# Patient Record
Sex: Male | Born: 1985 | Race: White | Hispanic: No | Marital: Single
Health system: Southern US, Community
[De-identification: ages and names within clinical notes are randomized; demographics above are authoritative.]

---
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
## 2016-03-11 DIAGNOSIS — R197 Diarrhea, unspecified: Secondary | ICD-10-CM | POA: Diagnosis not present

## 2016-03-11 DIAGNOSIS — K921 Melena: Secondary | ICD-10-CM | POA: Diagnosis not present

## 2016-05-11 DIAGNOSIS — K920 Hematemesis: Secondary | ICD-10-CM | POA: Diagnosis not present

## 2016-05-11 DIAGNOSIS — K921 Melena: Secondary | ICD-10-CM | POA: Diagnosis not present

## 2016-05-11 DIAGNOSIS — L509 Urticaria, unspecified: Secondary | ICD-10-CM | POA: Diagnosis not present

## 2016-05-11 DIAGNOSIS — R109 Unspecified abdominal pain: Secondary | ICD-10-CM | POA: Diagnosis not present

## 2016-07-24 DIAGNOSIS — F419 Anxiety disorder, unspecified: Secondary | ICD-10-CM | POA: Diagnosis not present

## 2016-07-24 DIAGNOSIS — L989 Disorder of the skin and subcutaneous tissue, unspecified: Secondary | ICD-10-CM | POA: Diagnosis not present

## 2016-08-21 DIAGNOSIS — D225 Melanocytic nevi of trunk: Secondary | ICD-10-CM | POA: Diagnosis not present

## 2016-08-21 DIAGNOSIS — L814 Other melanin hyperpigmentation: Secondary | ICD-10-CM | POA: Diagnosis not present

## 2017-01-06 DIAGNOSIS — R1031 Right lower quadrant pain: Secondary | ICD-10-CM | POA: Diagnosis not present

## 2017-01-06 DIAGNOSIS — F419 Anxiety disorder, unspecified: Secondary | ICD-10-CM | POA: Diagnosis not present

## 2017-01-06 DIAGNOSIS — R350 Frequency of micturition: Secondary | ICD-10-CM | POA: Diagnosis not present

## 2017-03-15 DIAGNOSIS — F419 Anxiety disorder, unspecified: Secondary | ICD-10-CM | POA: Diagnosis not present

## 2017-07-21 DIAGNOSIS — R0781 Pleurodynia: Secondary | ICD-10-CM | POA: Diagnosis not present

## 2017-07-23 ENCOUNTER — Other Ambulatory Visit: Payer: Self-pay | Admitting: Family Medicine

## 2017-07-23 ENCOUNTER — Ambulatory Visit
Admission: RE | Admit: 2017-07-23 | Discharge: 2017-07-23 | Disposition: A | Discharge status: Home / Self Care | Payer: Self-pay | Source: Ambulatory Visit | Attending: Family Medicine | Admitting: Family Medicine

## 2017-07-23 DIAGNOSIS — R52 Pain, unspecified: Secondary | ICD-10-CM

## 2017-07-23 DIAGNOSIS — R0781 Pleurodynia: Secondary | ICD-10-CM | POA: Diagnosis not present

## 2017-09-08 DIAGNOSIS — R0781 Pleurodynia: Secondary | ICD-10-CM | POA: Diagnosis not present

## 2017-09-08 DIAGNOSIS — F419 Anxiety disorder, unspecified: Secondary | ICD-10-CM | POA: Diagnosis not present

## 2017-09-29 DIAGNOSIS — M542 Cervicalgia: Secondary | ICD-10-CM | POA: Diagnosis not present

## 2017-09-29 DIAGNOSIS — R0781 Pleurodynia: Secondary | ICD-10-CM | POA: Diagnosis not present

## 2018-03-02 DIAGNOSIS — J301 Allergic rhinitis due to pollen: Secondary | ICD-10-CM | POA: Diagnosis not present

## 2018-06-01 DIAGNOSIS — F419 Anxiety disorder, unspecified: Secondary | ICD-10-CM | POA: Diagnosis not present

## 2018-09-01 DIAGNOSIS — M25551 Pain in right hip: Secondary | ICD-10-CM | POA: Diagnosis not present

## 2018-09-01 DIAGNOSIS — F419 Anxiety disorder, unspecified: Secondary | ICD-10-CM | POA: Diagnosis not present

## 2018-09-01 DIAGNOSIS — K920 Hematemesis: Secondary | ICD-10-CM | POA: Diagnosis not present

## 2018-09-22 DIAGNOSIS — M79671 Pain in right foot: Secondary | ICD-10-CM | POA: Diagnosis not present

## 2018-10-07 DIAGNOSIS — L309 Dermatitis, unspecified: Secondary | ICD-10-CM | POA: Diagnosis not present

## 2018-11-23 DIAGNOSIS — L308 Other specified dermatitis: Secondary | ICD-10-CM | POA: Diagnosis not present

## 2018-11-28 DIAGNOSIS — J111 Influenza due to unidentified influenza virus with other respiratory manifestations: Secondary | ICD-10-CM | POA: Diagnosis not present

## 2018-11-28 DIAGNOSIS — M791 Myalgia, unspecified site: Secondary | ICD-10-CM | POA: Diagnosis not present

## 2018-12-13 DIAGNOSIS — L308 Other specified dermatitis: Secondary | ICD-10-CM | POA: Diagnosis not present

## 2018-12-16 DIAGNOSIS — L308 Other specified dermatitis: Secondary | ICD-10-CM | POA: Diagnosis not present

## 2018-12-28 DIAGNOSIS — L308 Other specified dermatitis: Secondary | ICD-10-CM | POA: Diagnosis not present

## 2019-04-05 DIAGNOSIS — F5101 Primary insomnia: Secondary | ICD-10-CM | POA: Diagnosis not present

## 2019-04-05 DIAGNOSIS — F419 Anxiety disorder, unspecified: Secondary | ICD-10-CM | POA: Diagnosis not present

## 2019-05-17 IMAGING — CR DG RIBS 2V*L*
2 series · 2 of 2 positions shown · non-contrast
Comparison: None.

CLINICAL DATA: Left lower rib pain. No known trauma. BB marker at
site.

EXAM:
LEFT RIBS - 2 VIEW

[w ribs ap lower left]
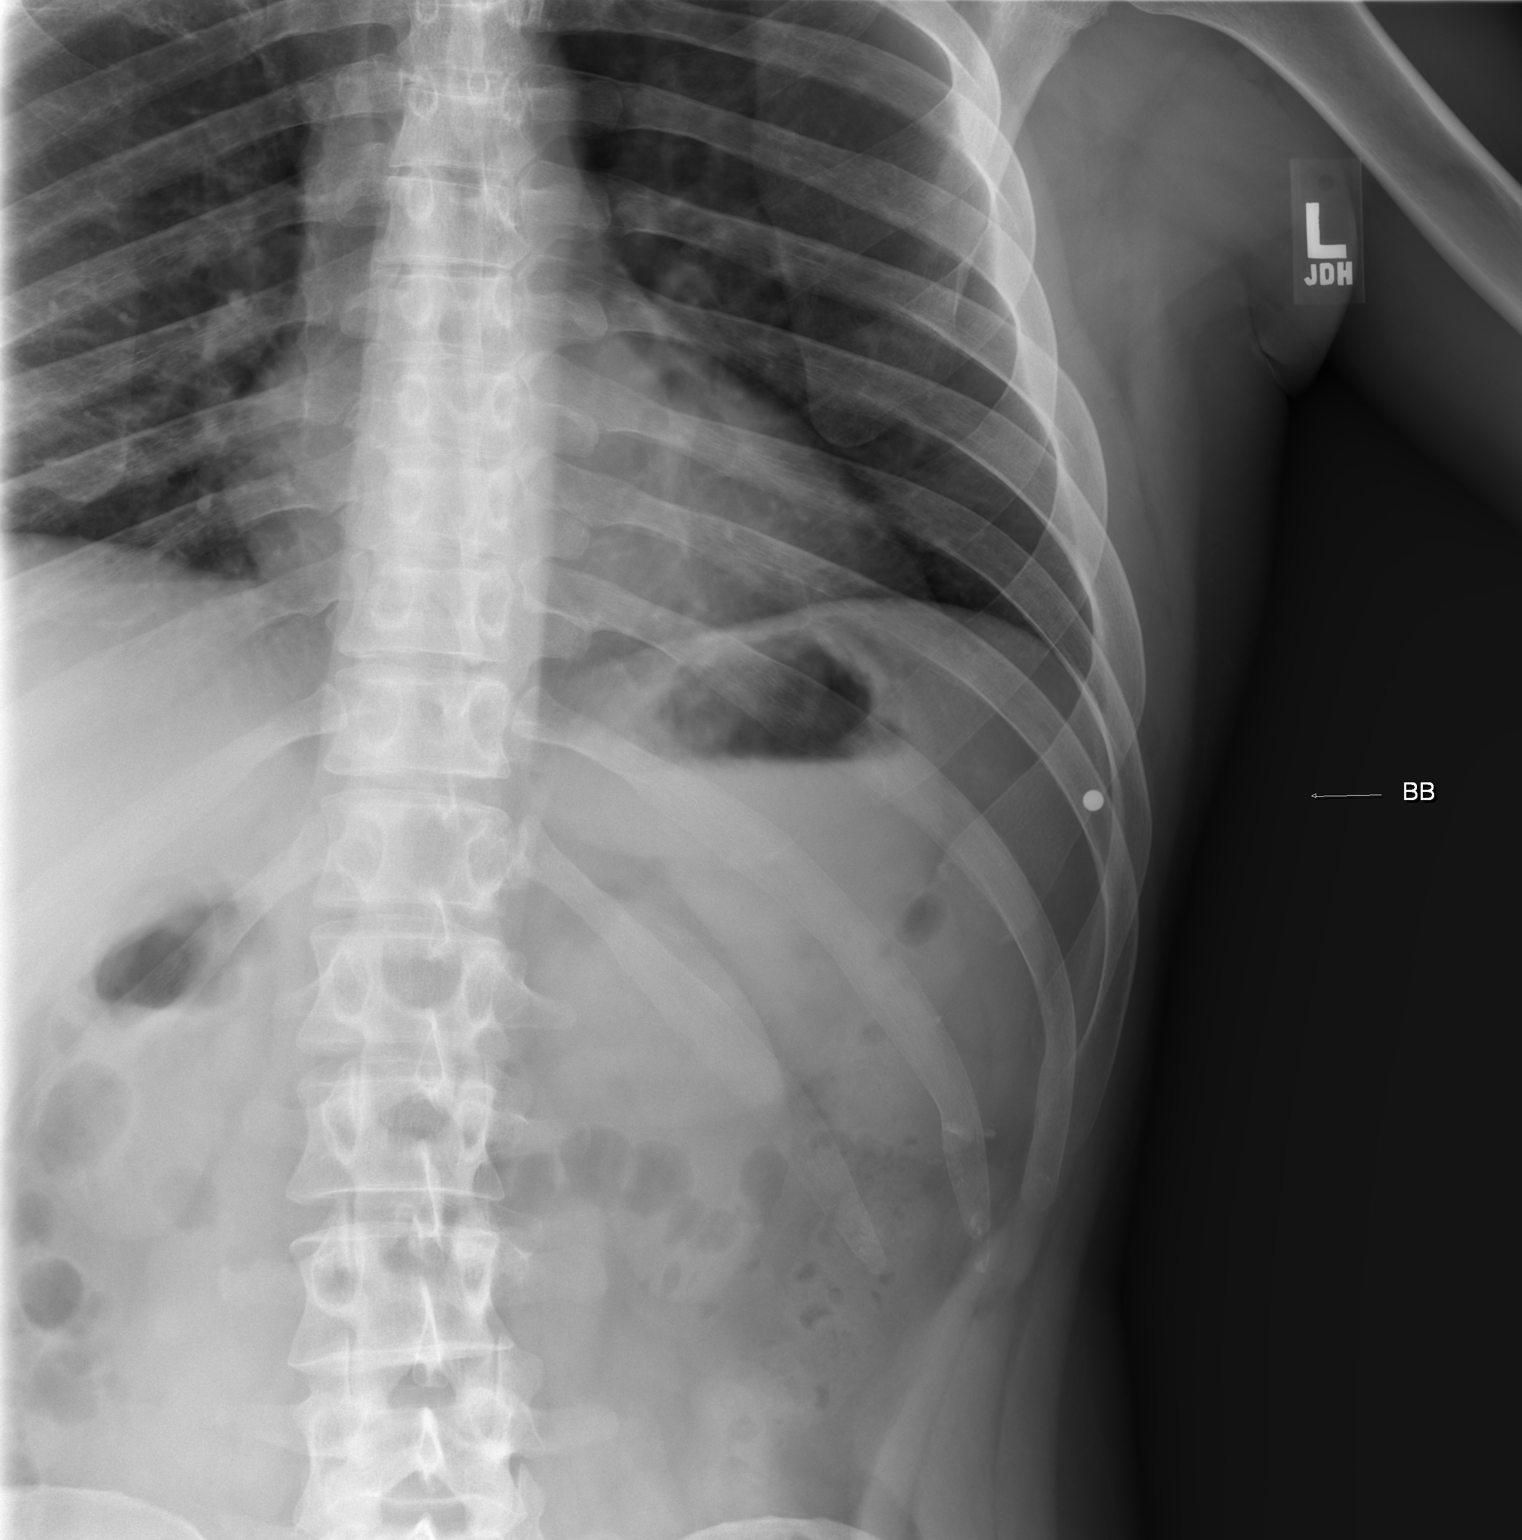

[w ribs obl left]
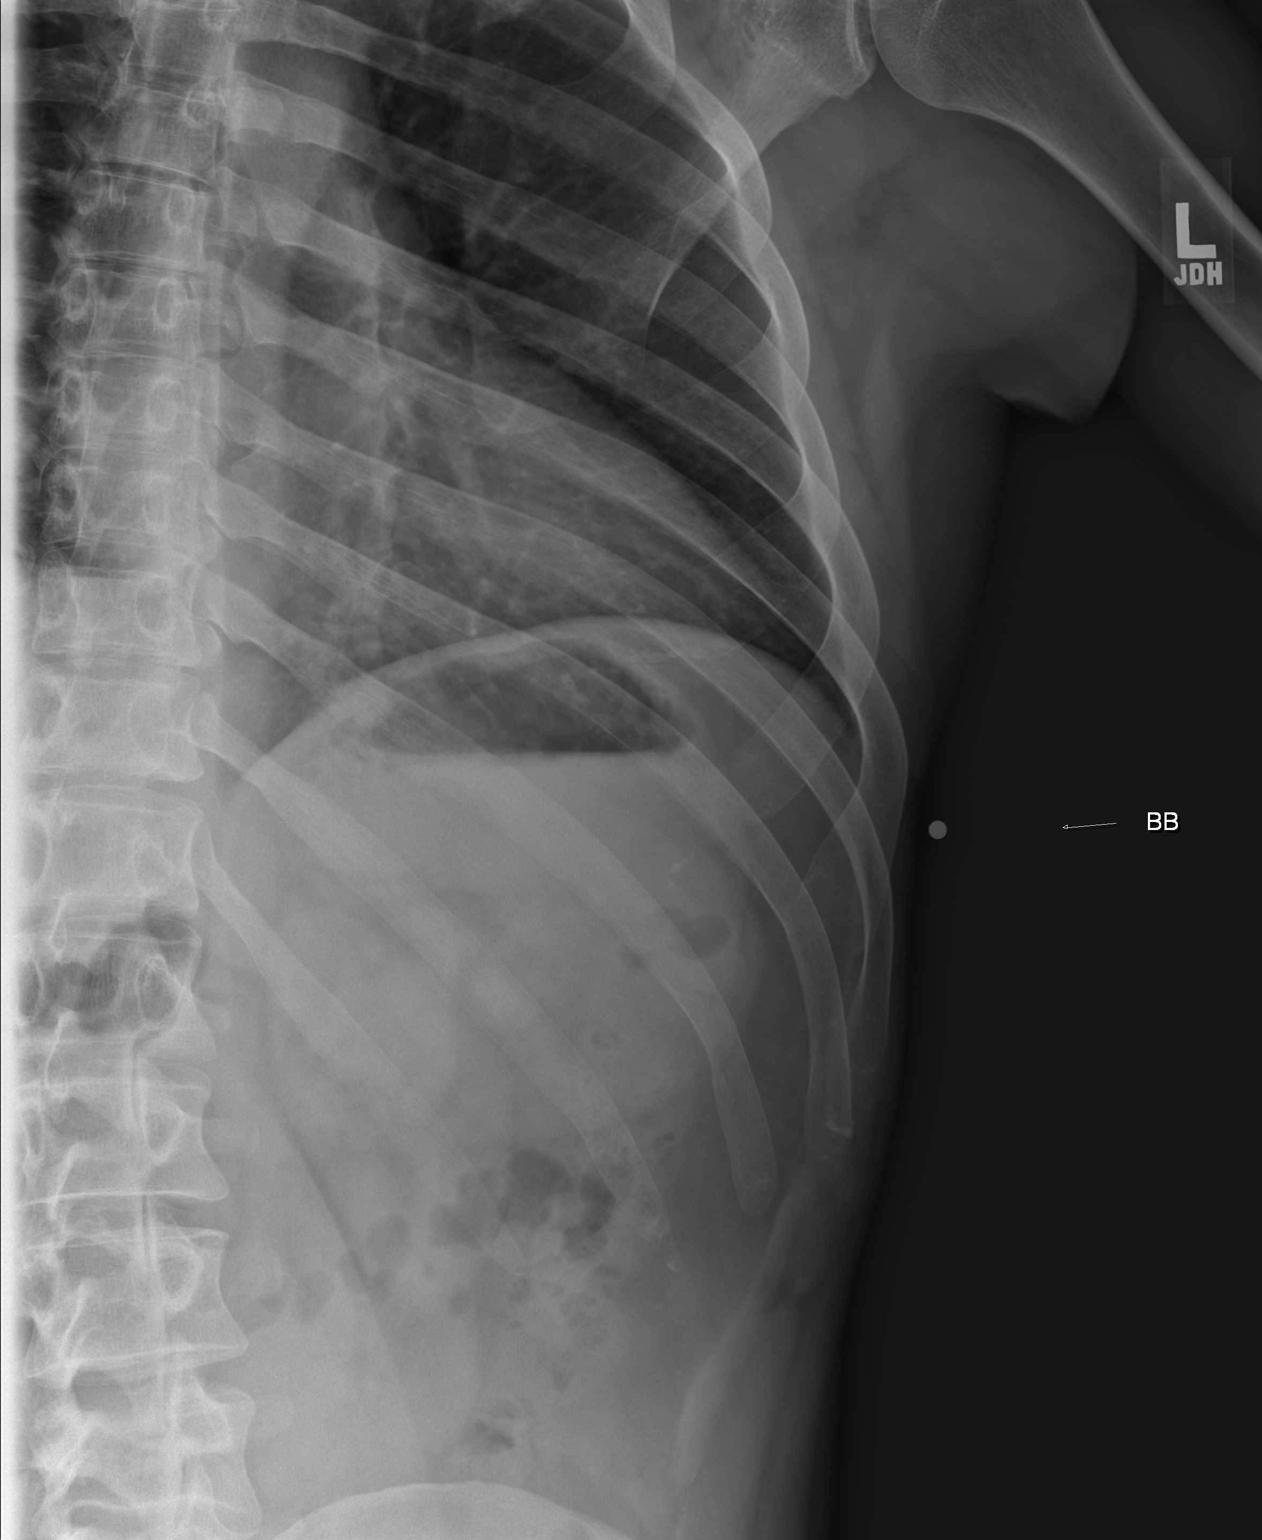

[2 of 2 positions shown; findings below may reference images not displayed]

FINDINGS: Two views of the left ribs are provided. No rib fracture or
displacement seen. No cortical irregularity or osseous lesion.
Adjacent soft tissues are unremarkable.
IMPRESSION: Negative.

## 2019-10-17 DIAGNOSIS — K921 Melena: Secondary | ICD-10-CM | POA: Diagnosis not present

## 2019-10-17 DIAGNOSIS — K2901 Acute gastritis with bleeding: Secondary | ICD-10-CM | POA: Diagnosis not present

## 2020-03-14 ENCOUNTER — Emergency Department (HOSPITAL_COMMUNITY)
Admission: EM | Admit: 2020-03-14 | Discharge: 2020-03-14 | Discharge status: Against Medical Advice | Payer: BLUE CROSS/BLUE SHIELD

## 2020-03-14 ENCOUNTER — Other Ambulatory Visit: Payer: Self-pay

## 2020-03-14 NOTE — Progress Notes (Signed)
Call to waiting area pt had departed pt then called on phone and notified bed was available and waiting pt responds that he will go to urgent care in the AM

## 2023-05-11 ENCOUNTER — Other Ambulatory Visit: Payer: Self-pay | Admitting: Physician Assistant

## 2023-05-11 DIAGNOSIS — S161XXA Strain of muscle, fascia and tendon at neck level, initial encounter: Secondary | ICD-10-CM

## 2023-05-17 ENCOUNTER — Ambulatory Visit
Admission: RE | Admit: 2023-05-17 | Discharge: 2023-05-17 | Disposition: A | Discharge status: Home / Self Care | Payer: 59 | Source: Ambulatory Visit | Attending: Physician Assistant | Admitting: Physician Assistant

## 2023-05-17 DIAGNOSIS — S161XXA Strain of muscle, fascia and tendon at neck level, initial encounter: Secondary | ICD-10-CM
